# Patient Record
Sex: Male | Born: 1950 | Race: White | Hispanic: No | Marital: Married
Health system: Southern US, Community
[De-identification: ages and names within clinical notes are randomized; demographics above are authoritative.]

---

## 2016-01-02 DIAGNOSIS — D487 Neoplasm of uncertain behavior of other specified sites: Secondary | ICD-10-CM | POA: Diagnosis not present

## 2016-01-02 DIAGNOSIS — C4339 Malignant melanoma of other parts of face: Secondary | ICD-10-CM | POA: Diagnosis not present

## 2016-04-16 DIAGNOSIS — L821 Other seborrheic keratosis: Secondary | ICD-10-CM | POA: Diagnosis not present

## 2016-04-16 DIAGNOSIS — Z08 Encounter for follow-up examination after completed treatment for malignant neoplasm: Secondary | ICD-10-CM | POA: Diagnosis not present

## 2016-04-16 DIAGNOSIS — Z85820 Personal history of malignant melanoma of skin: Secondary | ICD-10-CM | POA: Diagnosis not present

## 2016-04-16 DIAGNOSIS — Z1283 Encounter for screening for malignant neoplasm of skin: Secondary | ICD-10-CM | POA: Diagnosis not present

## 2016-08-06 DIAGNOSIS — L821 Other seborrheic keratosis: Secondary | ICD-10-CM | POA: Diagnosis not present

## 2016-08-06 DIAGNOSIS — B078 Other viral warts: Secondary | ICD-10-CM | POA: Diagnosis not present

## 2016-08-06 DIAGNOSIS — Z08 Encounter for follow-up examination after completed treatment for malignant neoplasm: Secondary | ICD-10-CM | POA: Diagnosis not present

## 2016-08-06 DIAGNOSIS — Z1283 Encounter for screening for malignant neoplasm of skin: Secondary | ICD-10-CM | POA: Diagnosis not present

## 2016-08-06 DIAGNOSIS — Z85820 Personal history of malignant melanoma of skin: Secondary | ICD-10-CM | POA: Diagnosis not present

## 2018-06-08 DIAGNOSIS — M25471 Effusion, right ankle: Secondary | ICD-10-CM | POA: Diagnosis not present

## 2018-06-08 DIAGNOSIS — M25571 Pain in right ankle and joints of right foot: Secondary | ICD-10-CM | POA: Diagnosis not present

## 2018-06-10 ENCOUNTER — Other Ambulatory Visit: Payer: Self-pay | Admitting: Family Medicine

## 2018-06-10 DIAGNOSIS — I8390 Asymptomatic varicose veins of unspecified lower extremity: Secondary | ICD-10-CM

## 2018-06-10 DIAGNOSIS — R609 Edema, unspecified: Secondary | ICD-10-CM

## 2018-06-10 DIAGNOSIS — M25571 Pain in right ankle and joints of right foot: Secondary | ICD-10-CM

## 2018-06-15 ENCOUNTER — Other Ambulatory Visit: Payer: Self-pay

## 2018-06-15 ENCOUNTER — Other Ambulatory Visit: Payer: Self-pay | Admitting: Family Medicine

## 2018-06-15 ENCOUNTER — Ambulatory Visit
Admission: RE | Admit: 2018-06-15 | Discharge: 2018-06-15 | Disposition: A | Discharge status: Home / Self Care | Payer: PPO | Source: Ambulatory Visit | Attending: Family Medicine | Admitting: Family Medicine

## 2018-06-15 DIAGNOSIS — M25571 Pain in right ankle and joints of right foot: Secondary | ICD-10-CM

## 2018-06-15 DIAGNOSIS — R609 Edema, unspecified: Secondary | ICD-10-CM

## 2018-06-15 DIAGNOSIS — I8390 Asymptomatic varicose veins of unspecified lower extremity: Secondary | ICD-10-CM

## 2018-06-15 DIAGNOSIS — R600 Localized edema: Secondary | ICD-10-CM | POA: Diagnosis not present

## 2018-06-16 ENCOUNTER — Other Ambulatory Visit: Payer: Self-pay

## 2018-07-01 ENCOUNTER — Ambulatory Visit
Admission: RE | Admit: 2018-07-01 | Discharge: 2018-07-01 | Disposition: A | Discharge status: Home / Self Care | Payer: PPO | Source: Ambulatory Visit | Attending: Family Medicine | Admitting: Family Medicine

## 2018-07-01 DIAGNOSIS — R609 Edema, unspecified: Secondary | ICD-10-CM

## 2018-07-01 DIAGNOSIS — I8390 Asymptomatic varicose veins of unspecified lower extremity: Secondary | ICD-10-CM

## 2018-07-01 DIAGNOSIS — M25571 Pain in right ankle and joints of right foot: Secondary | ICD-10-CM

## 2018-07-01 DIAGNOSIS — I83891 Varicose veins of right lower extremities with other complications: Secondary | ICD-10-CM | POA: Diagnosis not present

## 2018-07-01 NOTE — Consult Note (Signed)
Chief Complaint: Patient was seen in consultation today for symptomatic right lower extremity edema, varicose veins and venous insufficiency at the request of De Nurse  Referring Physician(s): De Nurse  History of Present Illness: Bradley Sharp is a 68 y.o. male who has had known palpable bilateral lower extremity varicose veins for several years, more prominently in the right lower extremity.  More recently, he has developed significant edema of the right foot and ankle extending up into the distal calf for roughly the last month which has been slowly improving.  This is associated with discomfort.  He has not had any ulcerations or bleeding.  His left lower extremity has been asymptomatic.  [name] has not had any prior varicose vein treatment.  He has a referral form for compression stockings rated at 15-20 mmHg.  He has not obtained compression stockings or worn them in the past.  The patient is very active and exercises regularly on an elliptical machine.  No past medical history on file.    Allergies: Patient has no known allergies.  Medications: None       No family history on file.  Social History   Socioeconomic History  . Marital status: Married    Spouse name: Not on file  . Number of children: Not on file  . Years of education: Not on file  . Highest education level: Not on file  Occupational History  . Not on file  Social Needs  . Financial resource strain: Not on file  . Food insecurity:    Worry: Not on file    Inability: Not on file  . Transportation needs:    Medical: Not on file    Non-medical: Not on file  Tobacco Use  . Smoking status: Not on file  Substance and Sexual Activity  . Alcohol use: Not on file  . Drug use: Not on file  . Sexual activity: Not on file  Lifestyle  . Physical activity:    Days per week: Not on file    Minutes per session: Not on file  . Stress: Not on file  Relationships  . Social connections:     Talks on phone: Not on file    Gets together: Not on file    Attends religious service: Not on file    Active member of club or organization: Not on file    Attends meetings of clubs or organizations: Not on file    Relationship status: Not on file  Other Topics Concern  . Not on file  Social History Narrative  . Not on file  Retired Immunologist.   Review of Systems: A 12 point ROS discussed and pertinent positives are indicated in the HPI above.  All other systems are negative.  Review of Systems  Constitutional: Negative.   Respiratory: Negative.   Cardiovascular: Positive for leg swelling. Negative for chest pain and palpitations.       Edema of right lower leg, ankle and foot. Varicose veins.  Gastrointestinal: Negative.   Genitourinary: Negative.   Musculoskeletal: Negative.   Neurological: Negative.     Vital Signs: BP (!) 142/87   Pulse 83   Temp 97.9 F (36.6 C) (Oral)   Resp 14   Ht 6' (1.829 m)   Wt 88.5 kg   SpO2 95%   BMI 26.45 kg/m   Physical Exam Vitals signs reviewed.  Constitutional:      General: He is not in acute distress.    Appearance:  He is not ill-appearing, toxic-appearing or diaphoretic.  Musculoskeletal:     Right lower leg: Edema present.     Left lower leg: No edema.     Comments: Right lower extremity: Palpable varicosities along the distal anterolateral thigh, posterior and medial aspect of the knee and medial aspect of the mid and proximal calf.  Edema beginning just above the ankle and continuing through the ankle and into the right foot.  Visible telangiectasias along the medial aspect of the distal calf and ankle with some associated mild hyperpigmentation of the skin.  No ulcerations.  Normal palpable arterial pulses.  Left lower extremity: There are some palpable varicosities along the medial aspect of the knee extending into the proximal calf.  No significant left lower extremity edema.  No ulcerations.  Normal palpable  arterial pulses.  Neurological:     Mental Status: He is alert and oriented to person, place, and time.    Imaging: US Venous Img Lower Unilateral Right  Result Date: 07/01/2018 CLINICAL DATA:  Varicose veins of the right lower extremity with symptomatic edema of the distal right calf, ankle and foot. Prior deep vein evaluation of the right lower extremity on 06/15/2018 was negative for DVT. The patient has now been referred for superficial venous evaluation including venous insufficiency duplex ultrasound and clinical consultation. EXAM: RIGHT LOWER EXTREMITY VENOUS DUPLEX ULTRASOUND TECHNIQUE: Gray-scale sonography with graded compression, as well as color Doppler and duplex ultrasound, were performed to evaluate the deep and superficial veins of the right/left lower extremity. Spectral Doppler was utilized to evaluate flow at rest and with distal augmentation maneuvers. A complete superficial venous insufficiency exam was performed in the upright standing position. I personally performed the technical portion of the exam. COMPARISON:  None. FINDINGS: Deep Venous System: Previous evaluation of the deep venous system of the right lower extremity on 06/15/2018 was reviewed. There is no evidence of deep vein thrombosis or deep venous reflux. Superficial Venous System: SFJ: No reflux identified with augmentation. GSV Prox Thigh: 8 mm.  No reflux identified with augmentation. GSV Mid Thigh: 5 mm.  No reflux identified with augmentation. GSV Lower Thigh: 5 mm.  No reflux identified with augmentation. GSV Knee: 8 mm. Sustained reflux of 2.2 seconds identified with augmentation. Communication with distal thigh and knee superficial varicosities identified. GSV Prox Calf: 6 mm. Extensive reflux lasting 6.4 seconds with augmentation. Multiple communicating varicosities. GSV Mid Calf: 6 mm. The mid calf GSV demonstrates reflux lasting 0.9 seconds. Multiple communicating varicosities are present. There also is a large  perforator vein communicating with the deep venous system. Sampling at the level of the perforator vein shows reflux lasting 2.4 cm with augmentation. GSV Distal Calf: 8 mm. Reflux with augmentation lasting 1.6 seconds. SPJ: No communication between the short saphenous vein and the popliteal vein. SSV Prox: 4 mm.  No reflux in the thigh or knee region. SSV Mid: 4-5 mm. There is communication with varicosities in the proximal and mid calf. Transient reflux at the mid calf level with augmentation lasting 0.8 seconds. SSV Distal: Distal calf segment shows no reflux. Varicosities demonstrate no evidence of thrombus. IMPRESSION: 1. Venous insufficiency of the great saphenous vein throughout the calf and extending to the knee with communication to multiple varicosities. Sustained reflux is demonstrated with augmentation. There is an incompetent perforator vein communicating with the mid calf segment of the GSV. 2. No significant insufficiency of the short saphenous vein. There is some communication with varicosities at the level of the proximal  and mid calf and transient reflux at the mid calf level. 3. Multiple distal thigh, knee and calf varicosities present. No evidence of superficial thrombophlebitis. Electronically Signed   By: Aletta Edouard M.D.   On: 07/01/2018 13:06   US Venous Img Lower Unilateral Right  Result Date: 06/15/2018 CLINICAL DATA:  Right lower extremity pain and edema. Evaluate for DVT. EXAM: RIGHT LOWER EXTREMITY VENOUS DOPPLER ULTRASOUND TECHNIQUE: Gray-scale sonography with graded compression, as well as color Doppler and duplex ultrasound were performed to evaluate the lower extremity deep venous systems from the level of the common femoral vein and including the common femoral, femoral, profunda femoral, popliteal and calf veins including the posterior tibial, peroneal and gastrocnemius veins when visible. The superficial great saphenous vein was also interrogated. Spectral Doppler was  utilized to evaluate flow at rest and with distal augmentation maneuvers in the common femoral, femoral and popliteal veins. COMPARISON:  None. FINDINGS: Contralateral Common Femoral Vein: Respiratory phasicity is normal and symmetric with the symptomatic side. No evidence of thrombus. Normal compressibility. Common Femoral Vein: No evidence of thrombus. Normal compressibility, respiratory phasicity and response to augmentation. Saphenofemoral Junction: No evidence of thrombus. Normal compressibility and flow on color Doppler imaging. Profunda Femoral Vein: No evidence of thrombus. Normal compressibility and flow on color Doppler imaging. Femoral Vein: No evidence of thrombus. Normal compressibility, respiratory phasicity and response to augmentation. Popliteal Vein: No evidence of thrombus. Normal compressibility, respiratory phasicity and response to augmentation. Calf Veins: No evidence of thrombus. Normal compressibility and flow on color Doppler imaging. Superficial Great Saphenous Vein: No evidence of thrombus. Normal compressibility. Venous Reflux:  None. Other Findings:  None. IMPRESSION: No evidence of DVT within the right lower extremity. Electronically Signed   By: Sandi Mariscal M.D.   On: 06/15/2018 17:02   [place] Rad Eval And Mgmt  Result Date: 07/01/2018 Please refer to "Notes" to see consult details.   Assessment and Plan:  A prior recent deep vein evaluation showed no evidence of deep vein thrombosis of the right lower extremity by ultrasound on 06/15/2018.  A full superficial venous evaluation of the right lower extremity was performed today in the standing upright position.  This demonstrates significant venous insufficiency of the right great saphenous vein throughout the calf and extending to the knee with communication to multiple varicosities.  Significant sustained reflux is demonstrated with augmentation lasting as much as 6 seconds in the proximal calf.  The great saphenous vein is also  enlarged at the level of the ankle.  A single large mid calf incompetent perforator vein was demonstrated.  The short saphenous vein demonstrated only transient reflux at the mid calf level.  There is some communication with varicosities in the proximal and mid calf with the short saphenous vein.  CEAP classification for right lower extremity venous insufficiency is C3, Ep, Asp, Pr.  Given that he has never tried compression stockings, I did recommend that we start with a 2-3 month trial of regular compression garment use to see if that has any benefit on right lower extremity edema and symptoms.  Given pattern of venous insufficiency, I did tell Mr. Schiller that he would benefit more from a thigh-high garment rated at 20-30 mmHg compression.  He plans to obtain compression garments and wear them regularly on his right leg for the next few months during waking hours.  I will follow-up with him in 2 to 3 months to determine symptom relief and reassess edema.  If he does not improve satisfactorily  with use of a compression garment, he is a candidate for endovenous laser occlusion of the great saphenous vein with additional sclerotherapy of communicating varicosities given pattern of disease and ultrasound findings today.  Thank you for this interesting consult.  I greatly enjoyed meeting LADAINIAN THERIEN and look forward to participating in their care.  A copy of this report was sent to the requesting provider on this date.  Electronically Signed: Azzie Roup 07/01/2018, 2:39 PM   I spent a total of 40 Minutes in face to face in clinical consultation, greater than 50% of which was counseling/coordinating care for right lower extremity venous insufficiency, varicose veins and edema.

## 2018-08-18 ENCOUNTER — Other Ambulatory Visit: Payer: Self-pay | Admitting: Interventional Radiology

## 2018-08-18 DIAGNOSIS — I8391 Asymptomatic varicose veins of right lower extremity: Secondary | ICD-10-CM

## 2018-09-24 ENCOUNTER — Other Ambulatory Visit: Payer: PPO

## 2019-01-20 ENCOUNTER — Encounter: Payer: Self-pay | Admitting: Interventional Radiology

## 2019-01-20 ENCOUNTER — Encounter: Payer: Self-pay | Admitting: *Deleted

## 2019-08-20 IMAGING — US US EXTREM LOW VENOUS*R*
1 series · 13 of 24 positions shown · non-contrast
Comparison: None.

CLINICAL DATA: Right lower extremity pain and edema. Evaluate for
DVT.



[Series 1: us extrem low venous*right* · 0.08mm/px · 13 of 27 slices shown]
[im 1/27]
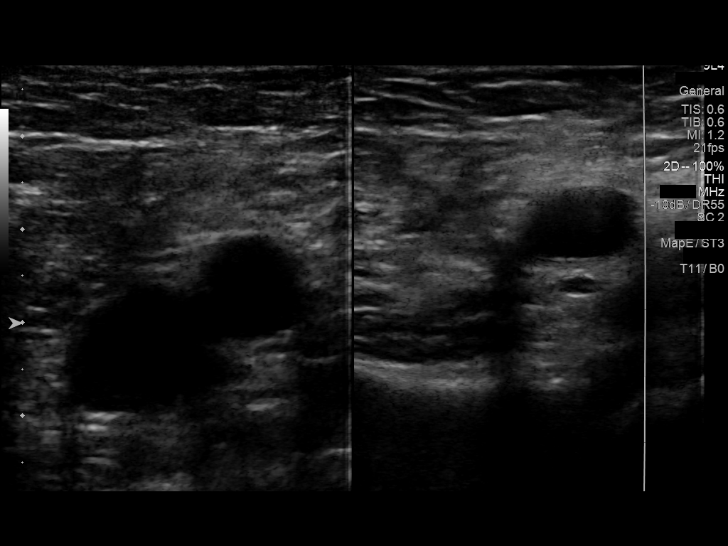
[im 3/27]
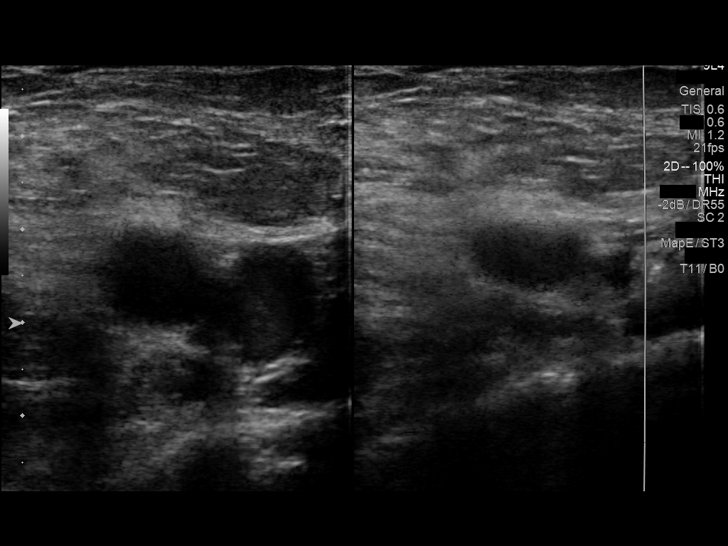
[im 5/27]
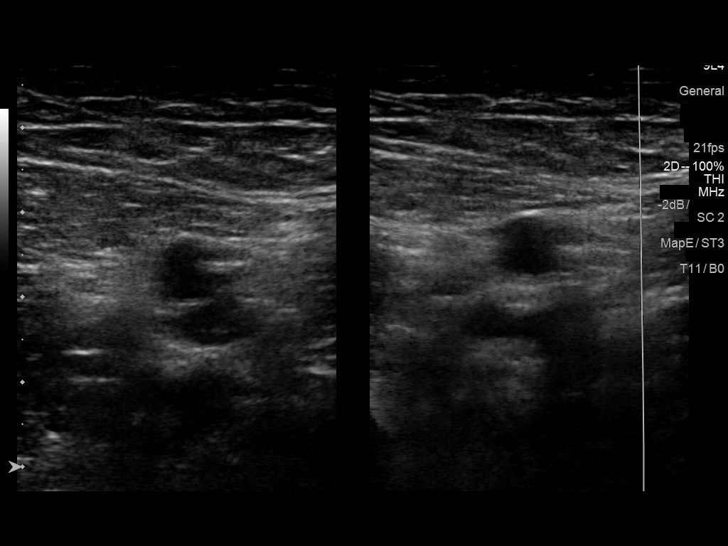
[im 7/27]
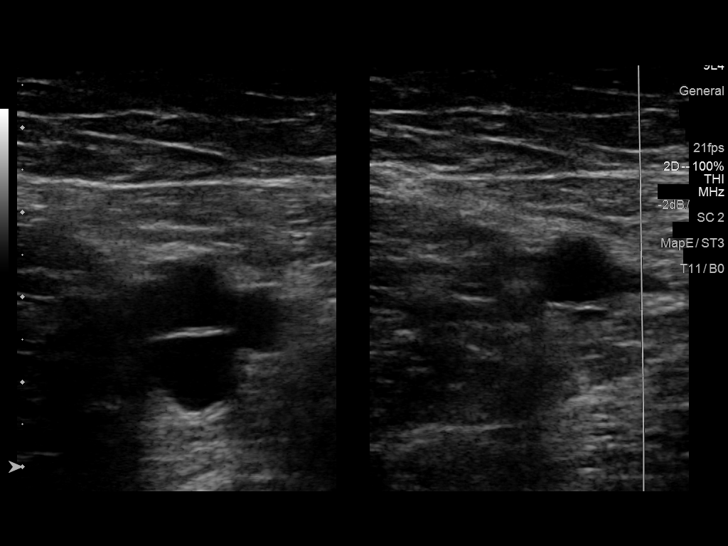
[im 10/27]
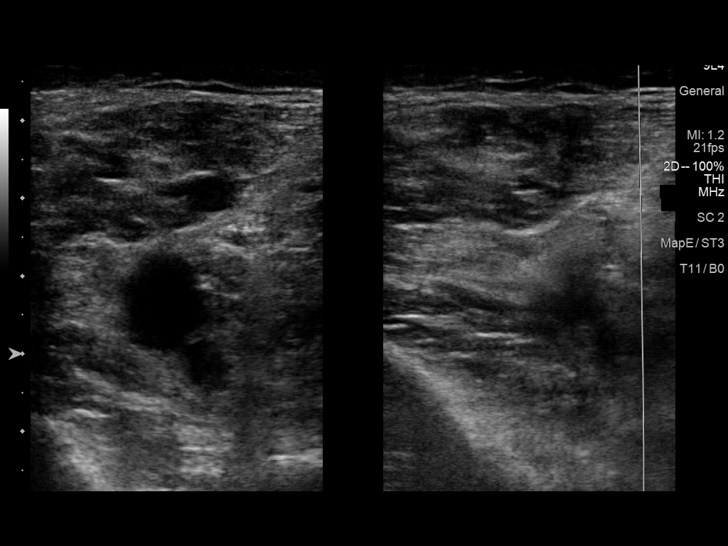
[im 12/27]
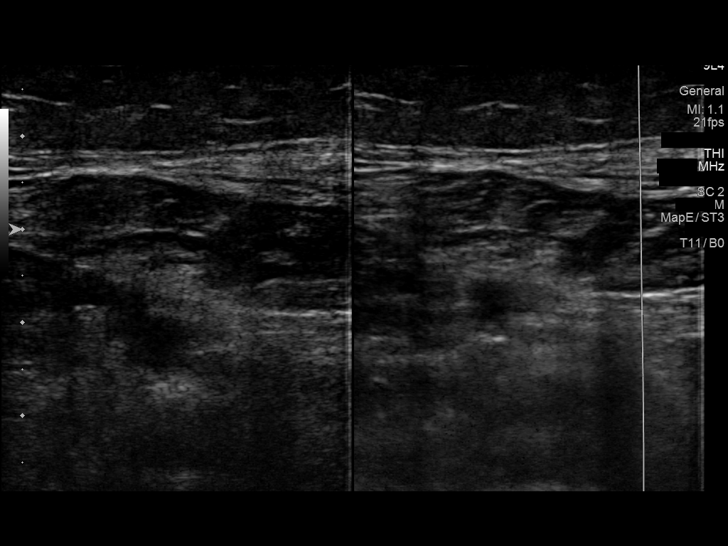
[im 14/27]
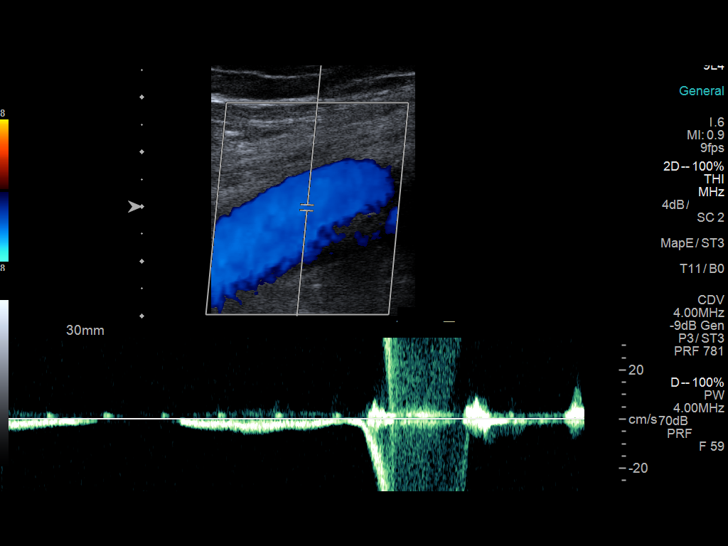
[im 15/27]
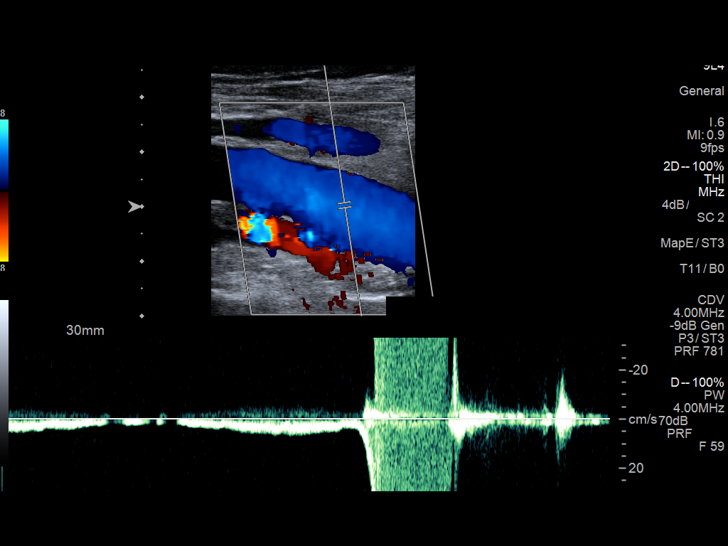
[im 17/27]
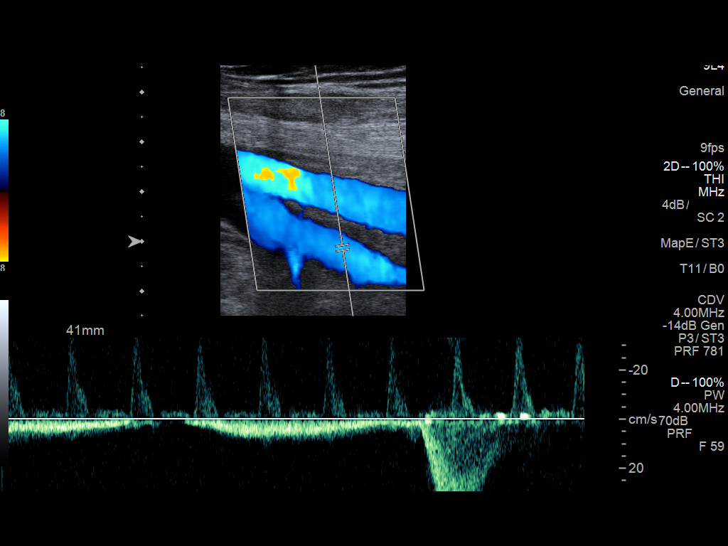
[im 20/27]
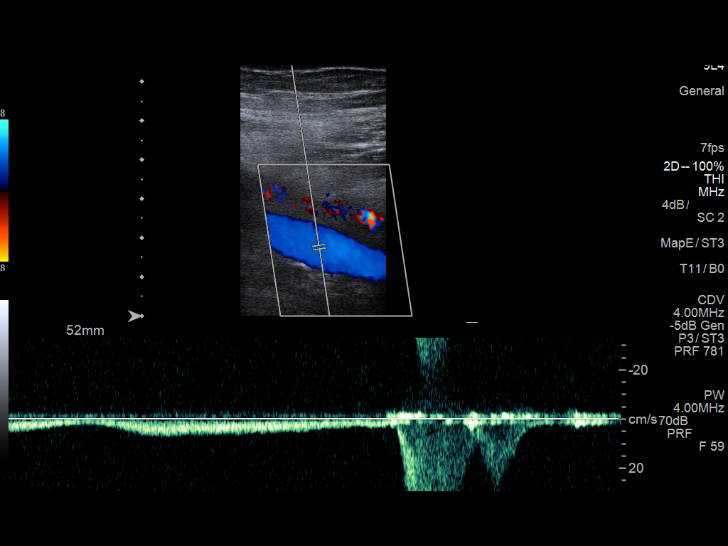
[im 22/27]
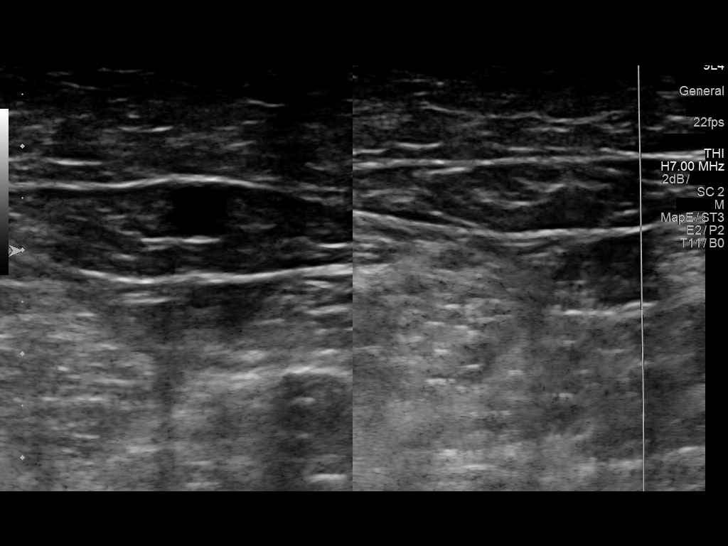
[im 24/27]
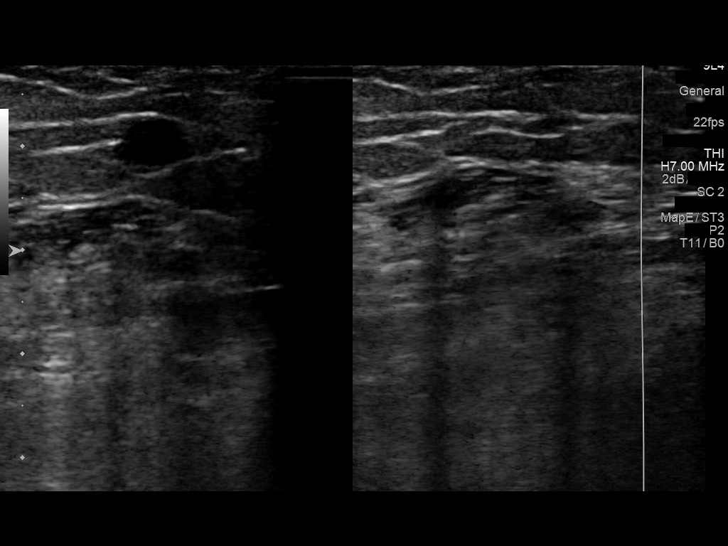
[im 27/27]
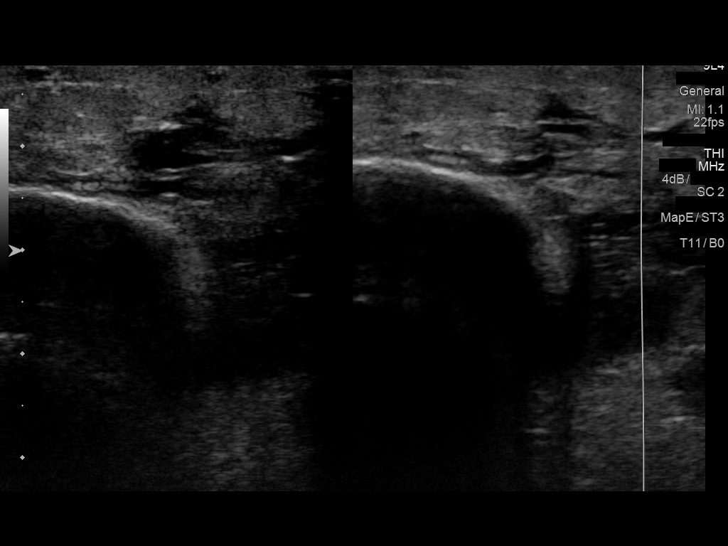

[13 of 24 positions shown; findings below may reference images not displayed]

FINDINGS: Contralateral Common Femoral Vein: Respiratory phasicity is normal
and symmetric with the symptomatic side. No evidence of thrombus.
Normal compressibility.

Common Femoral Vein: No evidence of thrombus. Normal
compressibility, respiratory phasicity and response to augmentation.

Saphenofemoral Junction: No evidence of thrombus. Normal
compressibility and flow on color Doppler imaging.

Profunda Femoral Vein: No evidence of thrombus. Normal
compressibility and flow on color Doppler imaging.

Femoral Vein: No evidence of thrombus. Normal compressibility,
respiratory phasicity and response to augmentation.

Popliteal Vein: No evidence of thrombus. Normal compressibility,
respiratory phasicity and response to augmentation.

Calf Veins: No evidence of thrombus. Normal compressibility and flow
on color Doppler imaging.

Superficial Great Saphenous Vein: No evidence of thrombus. Normal
compressibility.

Venous Reflux:  None.

Other Findings:  None.
IMPRESSION: No evidence of DVT within the right lower extremity.

## 2019-12-21 DIAGNOSIS — H6123 Impacted cerumen, bilateral: Secondary | ICD-10-CM | POA: Diagnosis not present

## 2019-12-21 DIAGNOSIS — R030 Elevated blood-pressure reading, without diagnosis of hypertension: Secondary | ICD-10-CM | POA: Diagnosis not present

## 2020-01-17 DIAGNOSIS — Z20822 Contact with and (suspected) exposure to covid-19: Secondary | ICD-10-CM | POA: Diagnosis not present

## 2020-03-24 DIAGNOSIS — D225 Melanocytic nevi of trunk: Secondary | ICD-10-CM | POA: Diagnosis not present

## 2020-03-24 DIAGNOSIS — Z1283 Encounter for screening for malignant neoplasm of skin: Secondary | ICD-10-CM | POA: Diagnosis not present

## 2020-03-24 DIAGNOSIS — Z08 Encounter for follow-up examination after completed treatment for malignant neoplasm: Secondary | ICD-10-CM | POA: Diagnosis not present

## 2020-03-24 DIAGNOSIS — Z85820 Personal history of malignant melanoma of skin: Secondary | ICD-10-CM | POA: Diagnosis not present

## 2020-05-10 DIAGNOSIS — H43813 Vitreous degeneration, bilateral: Secondary | ICD-10-CM | POA: Diagnosis not present
